# Patient Record
Sex: Female | Born: 1990 | Race: White | Hispanic: No | Marital: Single | Smoking: Current every day smoker
Health system: Southern US, Community
[De-identification: ages and names within clinical notes are randomized; demographics above are authoritative.]

---

## 2008-01-26 ENCOUNTER — Emergency Department: Payer: Self-pay | Admitting: Emergency Medicine

## 2008-05-23 ENCOUNTER — Emergency Department: Payer: Self-pay | Admitting: Emergency Medicine

## 2008-09-04 ENCOUNTER — Emergency Department: Payer: Self-pay | Admitting: Emergency Medicine

## 2009-04-03 ENCOUNTER — Inpatient Hospital Stay: Payer: Self-pay

## 2009-12-25 ENCOUNTER — Ambulatory Visit: Payer: Self-pay | Admitting: Internal Medicine

## 2010-04-26 ENCOUNTER — Emergency Department: Payer: Self-pay

## 2010-11-05 ENCOUNTER — Ambulatory Visit: Payer: Self-pay | Admitting: Internal Medicine

## 2010-12-22 DIAGNOSIS — O021 Missed abortion: Secondary | ICD-10-CM

## 2010-12-22 HISTORY — PX: DILATION AND EVACUATION: SHX1459

## 2011-03-11 ENCOUNTER — Emergency Department: Payer: Self-pay | Admitting: Unknown Physician Specialty

## 2011-04-14 ENCOUNTER — Emergency Department: Payer: Self-pay | Admitting: Emergency Medicine

## 2011-09-11 ENCOUNTER — Other Ambulatory Visit: Payer: Self-pay | Admitting: Internal Medicine

## 2011-09-11 MED ORDER — CLONAZEPAM 1 MG PO TABS: 1.0000 mg | ORAL_TABLET | Freq: Three times a day (TID) | ORAL | Status: DC | PRN

## 2011-09-11 NOTE — Telephone Encounter (Signed)
She is not going to be seen here because she has [place] [facility]. She will need to establish at the old practice for care. We can give one month refill.

## 2011-09-16 ENCOUNTER — Telehealth: Payer: Self-pay | Admitting: Internal Medicine

## 2011-09-16 NOTE — Telephone Encounter (Signed)
I think we filled one month. We should check with her pharmacy.

## 2011-09-16 NOTE — Telephone Encounter (Signed)
Patient called and stated she only needs 1 refill on her medication clonazepam until she setup's a appointment with [facility] but they can't see her until November.  Please advise.

## 2011-09-17 NOTE — Telephone Encounter (Signed)
Tried calling patient, but got recording stating that her number had been disconnected.

## 2011-09-17 NOTE — Telephone Encounter (Signed)
Since, we filled on Sept 20th, no refill until Oct 20th.

## 2011-09-17 NOTE — Telephone Encounter (Signed)
Checked with pharmacy. We did fill for one month on Sept 20, but [facility] will not be able to see patient until Nov. So patient is asking for one more refill to last the month of October.

## 2011-09-18 NOTE — Telephone Encounter (Signed)
Patient notified

## 2011-09-22 ENCOUNTER — Emergency Department: Payer: Self-pay | Admitting: Emergency Medicine

## 2011-09-22 NOTE — Telephone Encounter (Signed)
Patient called back again today, she says that when her rx for the klonopin was filled it was only for a quantity of 30 and she takes this 3 times a day so it will not last her until 10-11-11.

## 2011-09-22 NOTE — Telephone Encounter (Signed)
We can fill an additional 60pills.

## 2011-09-22 NOTE — Telephone Encounter (Signed)
Left message for patient to return my call.

## 2011-10-06 ENCOUNTER — Encounter: Payer: Self-pay | Admitting: Internal Medicine

## 2011-10-07 ENCOUNTER — Emergency Department: Payer: Self-pay | Admitting: *Deleted

## 2011-10-08 ENCOUNTER — Emergency Department: Payer: Self-pay | Admitting: Emergency Medicine

## 2011-10-21 ENCOUNTER — Other Ambulatory Visit: Payer: Self-pay | Admitting: Internal Medicine

## 2011-10-21 NOTE — Telephone Encounter (Signed)
Patient would like 90 day refill

## 2011-10-22 ENCOUNTER — Other Ambulatory Visit: Payer: Self-pay | Admitting: Internal Medicine

## 2011-10-23 ENCOUNTER — Telehealth: Payer: Self-pay | Admitting: Internal Medicine

## 2011-10-23 NOTE — Telephone Encounter (Signed)
Patient wanting additional refills for her Klonopin 1mg .

## 2011-10-23 NOTE — Telephone Encounter (Signed)
1 refill, no further

## 2011-10-24 ENCOUNTER — Other Ambulatory Visit: Payer: Self-pay | Admitting: *Deleted

## 2011-10-24 MED ORDER — CLONAZEPAM 1 MG PO TABS: 1.0000 mg | ORAL_TABLET | Freq: Three times a day (TID) | ORAL | Status: DC | PRN

## 2011-10-24 NOTE — Telephone Encounter (Signed)
Called in Rx.  Tried to contact patient and Phone has been disconnected.

## 2011-10-27 ENCOUNTER — Telehealth: Payer: Self-pay | Admitting: Internal Medicine

## 2011-10-27 ENCOUNTER — Telehealth: Payer: Self-pay | Admitting: *Deleted

## 2011-10-27 MED ORDER — CLONAZEPAM 1 MG PO TABS: 1.0000 mg | ORAL_TABLET | Freq: Three times a day (TID) | ORAL | Status: DC | PRN

## 2011-10-27 NOTE — Telephone Encounter (Signed)
Pt aware that we called in klonopin #90 and that she needs OV for any further RFs. She will call back to schedule f/u

## 2011-10-27 NOTE — Telephone Encounter (Signed)
Called in RX. Attempted to call patient. No answer, no VM.

## 2011-10-27 NOTE — Telephone Encounter (Signed)
Ok for # 90? Pt has no upcoming apts.

## 2011-10-27 NOTE — Telephone Encounter (Signed)
She can have #90 with NO further refills.

## 2011-10-27 NOTE — Telephone Encounter (Signed)
[phone] Pt called wanted to know what only 30 pills were called in for clonazepam.  She normaly gets 90.  See takes 3 at a time as needed. [facility] [address]

## 2011-10-31 NOTE — Telephone Encounter (Signed)
Patient states that she did get the medication from the pharmacy. I did apologize for takiing  So long to follow up.  She said that it was all right and that she was very thankful that I did follow up.

## 2011-11-28 ENCOUNTER — Emergency Department: Payer: Self-pay | Admitting: Internal Medicine

## 2012-01-07 ENCOUNTER — Emergency Department: Payer: Self-pay | Admitting: Emergency Medicine

## 2012-01-08 LAB — URINALYSIS, COMPLETE
Bilirubin,UR: NEGATIVE
Glucose,UR: NEGATIVE mg/dL (ref 0–75)
Ketone: NEGATIVE
Specific Gravity: 1.021 (ref 1.003–1.030)
Squamous Epithelial: 1
WBC UR: 973 /HPF (ref 0–5)

## 2012-01-08 LAB — DRUG SCREEN, URINE
Barbiturates, Ur Screen: NEGATIVE (ref ?–200)
Cannabinoid 50 Ng, Ur ~~LOC~~: NEGATIVE (ref ?–50)
Cocaine Metabolite,Ur ~~LOC~~: POSITIVE (ref ?–300)
MDMA (Ecstasy)Ur Screen: NEGATIVE (ref ?–500)
Phencyclidine (PCP) Ur S: NEGATIVE (ref ?–25)

## 2012-01-08 LAB — PREGNANCY, URINE: Pregnancy Test, Urine: NEGATIVE m[IU]/mL

## 2012-04-30 ENCOUNTER — Emergency Department: Payer: Self-pay | Admitting: Emergency Medicine

## 2012-04-30 LAB — COMPREHENSIVE METABOLIC PANEL
Albumin: 3.7 g/dL (ref 3.4–5.0)
Alkaline Phosphatase: 51 U/L (ref 50–136)
Anion Gap: 10 (ref 7–16)
BUN: 19 mg/dL — ABNORMAL HIGH (ref 7–18)
Bilirubin,Total: 0.5 mg/dL (ref 0.2–1.0)
Calcium, Total: 8 mg/dL — ABNORMAL LOW (ref 8.5–10.1)
Chloride: 104 mmol/L (ref 98–107)
Co2: 23 mmol/L (ref 21–32)
Creatinine: 1 mg/dL (ref 0.60–1.30)
EGFR (Non-African Amer.): >60
Osmolality: 280 (ref 275–301)
Potassium: 3 mmol/L — ABNORMAL LOW (ref 3.5–5.1)
SGOT(AST): 25 U/L (ref 15–37)
SGPT (ALT): 16 U/L
Total Protein: 6.6 g/dL (ref 6.4–8.2)

## 2012-04-30 LAB — DRUG SCREEN, URINE
Benzodiazepine, Ur Scrn: NEGATIVE (ref ?–200)
Cannabinoid 50 Ng, Ur ~~LOC~~: POSITIVE (ref ?–50)
Opiate, Ur Screen: NEGATIVE (ref ?–300)
Phencyclidine (PCP) Ur S: NEGATIVE (ref ?–25)

## 2012-04-30 LAB — PREGNANCY, URINE: Pregnancy Test, Urine: NEGATIVE m[IU]/mL

## 2012-04-30 LAB — CBC
HGB: 11.5 g/dL — ABNORMAL LOW (ref 12.0–16.0)
MCH: 28.9 pg (ref 26.0–34.0)
MCHC: 33.1 g/dL (ref 32.0–36.0)
MCV: 87 fL (ref 80–100)
RBC: 3.99 10*6/uL (ref 3.80–5.20)
WBC: 9.8 10*3/uL (ref 3.6–11.0)

## 2012-04-30 LAB — URINALYSIS, COMPLETE
Bacteria: NONE SEEN
Blood: NEGATIVE
Glucose,UR: NEGATIVE mg/dL (ref 0–75)
Leukocyte Esterase: NEGATIVE
Nitrite: NEGATIVE
Ph: 6 (ref 4.5–8.0)
WBC UR: <1 /HPF (ref 0–5)

## 2012-04-30 LAB — ACETAMINOPHEN LEVEL: Acetaminophen: 2 ug/mL — ABNORMAL LOW

## 2012-04-30 LAB — ETHANOL: Ethanol %: <0.003 % (ref 0.000–0.080)

## 2019-04-22 DIAGNOSIS — T424X1A Poisoning by benzodiazepines, accidental (unintentional), initial encounter: Secondary | ICD-10-CM

## 2019-04-22 HISTORY — DX: Poisoning by benzodiazepines, accidental (unintentional), initial encounter: T42.4X1A

## 2019-05-10 ENCOUNTER — Inpatient Hospital Stay
Admission: EM | Admit: 2019-05-10 | Discharge: 2019-05-11 | DRG: 917 | Discharge status: Against Medical Advice | Payer: Self-pay | Attending: Specialist | Admitting: Specialist

## 2019-05-10 ENCOUNTER — Encounter: Payer: Self-pay | Admitting: Emergency Medicine

## 2019-05-10 ENCOUNTER — Other Ambulatory Visit: Payer: Self-pay

## 2019-05-10 DIAGNOSIS — F19929 Other psychoactive substance use, unspecified with intoxication, unspecified: Secondary | ICD-10-CM

## 2019-05-10 DIAGNOSIS — G92 Toxic encephalopathy: Secondary | ICD-10-CM | POA: Diagnosis present

## 2019-05-10 DIAGNOSIS — F17200 Nicotine dependence, unspecified, uncomplicated: Secondary | ICD-10-CM | POA: Diagnosis present

## 2019-05-10 DIAGNOSIS — R4182 Altered mental status, unspecified: Secondary | ICD-10-CM

## 2019-05-10 DIAGNOSIS — F10129 Alcohol abuse with intoxication, unspecified: Secondary | ICD-10-CM | POA: Diagnosis present

## 2019-05-10 DIAGNOSIS — T424X1A Poisoning by benzodiazepines, accidental (unintentional), initial encounter: Principal | ICD-10-CM | POA: Diagnosis present

## 2019-05-10 DIAGNOSIS — H5704 Mydriasis: Secondary | ICD-10-CM | POA: Diagnosis present

## 2019-05-10 DIAGNOSIS — D649 Anemia, unspecified: Secondary | ICD-10-CM | POA: Diagnosis present

## 2019-05-10 DIAGNOSIS — Z1159 Encounter for screening for other viral diseases: Secondary | ICD-10-CM

## 2019-05-10 LAB — COMPREHENSIVE METABOLIC PANEL
ALT: 12 U/L (ref 0–44)
AST: 17 U/L (ref 15–41)
Albumin: 3.6 g/dL (ref 3.5–5.0)
Alkaline Phosphatase: 33 U/L — ABNORMAL LOW (ref 38–126)
Anion gap: 6 (ref 5–15)
BUN: 16 mg/dL (ref 6–20)
CO2: 26 mmol/L (ref 22–32)
Calcium: 8.8 mg/dL — ABNORMAL LOW (ref 8.9–10.3)
Chloride: 104 mmol/L (ref 98–111)
Creatinine, Ser: 0.48 mg/dL (ref 0.44–1.00)
GFR calc Af Amer: >60 mL/min (ref >60)
GFR calc non Af Amer: >60 mL/min (ref >60)
Glucose, Bld: 98 mg/dL (ref 70–99)
Potassium: 3.9 mmol/L (ref 3.5–5.1)
Sodium: 136 mmol/L (ref 135–145)
Total Bilirubin: 0.4 mg/dL (ref 0.3–1.2)
Total Protein: 6.6 g/dL (ref 6.5–8.1)

## 2019-05-10 LAB — SALICYLATE LEVEL: Salicylate Lvl: <7 mg/dL (ref 2.8–30.0)

## 2019-05-10 LAB — CBC
HCT: 31.8 % — ABNORMAL LOW (ref 36.0–46.0)
Hemoglobin: 10 g/dL — ABNORMAL LOW (ref 12.0–15.0)
MCH: 24.6 pg — ABNORMAL LOW (ref 26.0–34.0)
MCHC: 31.4 g/dL (ref 30.0–36.0)
MCV: 78.1 fL — ABNORMAL LOW (ref 80.0–100.0)
Platelets: 336 10*3/uL (ref 150–400)
RBC: 4.07 MIL/uL (ref 3.87–5.11)
RDW: 18.8 % — ABNORMAL HIGH (ref 11.5–15.5)
WBC: 8.7 10*3/uL (ref 4.0–10.5)
nRBC: 0 % (ref 0.0–0.2)

## 2019-05-10 LAB — ETHANOL: Alcohol, Ethyl (B): <10 mg/dL (ref <10)

## 2019-05-10 LAB — ACETAMINOPHEN LEVEL: Acetaminophen (Tylenol), Serum: <10 ug/mL — ABNORMAL LOW (ref 10–30)

## 2019-05-10 MED ORDER — NALOXONE HCL 2 MG/2ML IJ SOSY
1.0000 mg | PREFILLED_SYRINGE | Freq: Once | INTRAMUSCULAR | Status: AC
  Administered 2019-05-10: 23:00:00 1 mg via INTRAVENOUS
  Filled 2019-05-10: qty 2

## 2019-05-10 MED ORDER — SODIUM CHLORIDE 0.9 % IV BOLUS
1000.0000 mL | Freq: Once | INTRAVENOUS | Status: AC
  Administered 2019-05-10: 1000 mL via INTRAVENOUS

## 2019-05-10 NOTE — ED Provider Notes (Signed)
[facility] Emergency Department Provider Note  Time seen: 10:34 PM  I have reviewed the triage vital signs and the nursing notes.   HISTORY  Chief Complaint Drug Overdose   HPI Sheila Guzman is a 28 y.o. female with no known past medical history presents to the emergency department after a likely overdose.  According to EMS report patient was at [facility], they were speaking to the patient while possible shoplifting outside of [employer] when the patient went unresponsive.  Boyfriend states the patient took an unknown amount of Xanax or Klonopin, it is unclear if she was drinking alcohol.  Upon arrival patient is very somnolent, will awaken to voice or physical stimuli, very slurred speech will attempt answer questions, denies taking any medications.  Patient falls asleep but not being actively engaged.  Patient's pupils are dilated.  She is satting 100% on room air.  Unable to answer review of systems questioning unable to follow many commands.   History reviewed. No pertinent past medical history.  There are no active problems to display for this patient.   History reviewed. No pertinent surgical history.  Prior to Admission medications   Medication Sig Start Date End Date Taking? Authorizing Provider  clonazePAM (KLONOPIN) 1 MG tablet Take 1 tablet (1 mg total) by mouth 3 (three) times daily as needed for anxiety. 10/27/11 10/26/12  Shelia Media, MD    Allergies  Allergen Reactions  . Amoxicillin     No family history on file.  Social History Social History   Tobacco Use  . Smoking status: Current Every Day Smoker  . Smokeless tobacco: Never Used  Substance Use Topics  . Alcohol use: Yes  . Drug use: Yes    Review of Systems Unable to obtain adequate/accurate review of systems secondary to somnolence/altered mental status  ____________________________________________   PHYSICAL EXAM:  VITAL SIGNS: ED Triage Vitals  Enc Vitals Group      BP 05/10/19 2212 126/90     Pulse Rate 05/10/19 2212 71     Resp 05/10/19 2212 (!) 24     Temp 05/10/19 2212 97.6 F (36.4 C)     Temp Source 05/10/19 2212 Oral     SpO2 05/10/19 2212 100 %     Weight 05/10/19 2213 188 lb (85.3 kg)     Height 05/10/19 2213 5\' 9"  (1.753 m)     Head Circumference --      Peak Flow --      Pain Score 05/10/19 2213 0     Pain Loc --      Pain Edu? --      Excl. in GC? --    Constitutional: Patient arrives extremely somnolent, does awaken to physical stimuli will briefly answer questions before falling back asleep.  Is unclear if her answers are accurate at this time.  Denies taking anything although the boyfriend states she did take Xanax or Klonopin.  Nurse states the patient did deny SI when she arrived. Eyes: 4 mm pupils, reactive. ENT      Head: Normocephalic and atraumatic.      Mouth/Throat: Dry mucous membranes Cardiovascular: Normal rate, regular rhythm. No murmurs, rubs, or gallops. Respiratory: Normal respiratory effort without tachypnea nor retractions. Breath sounds are clear Gastrointestinal: Soft, no reaction to abdominal palpation. Musculoskeletal: Patient does appear to move all extremities at times. Neurologic: Extremely somnolent, does appear to move all extremities at times. Skin:  Skin is warm, dry and intact.  Psychiatric: Slurred speech, extreme  somnolence  ____________________________________________   INITIAL IMPRESSION / ASSESSMENT AND PLAN / ED COURSE  Pertinent labs & imaging results that were available during my care of the patient were reviewed by me and considered in my medical decision making (see chart for details).   Patient presents emergency department after a possible overdose.  Per EMS boyfriend at the scene stated the patient took an unknown quantity of Xanax or Klonopin, possibly drinking alcohol as well.  Upon arrival patient is extremely somnolent, denies taking anything, falls asleep and not being  actively engaged.  Falls asleep even when being actively engaged after several seconds.  Patient does appear to be protecting her airway, we will hold off on intubation at this time.  Patient appears to be under the influence with dilated pupils.  We will check labs, obtain a urine drug screen.  We will treat with Narcan as a precaution with the possibility of polysubstance abuse.  Patient will likely require prolonged monitoring in the emergency department given her current state.  Blood work largely nonrevealing.  Alcohol negative.  Urinalysis and drug screen pending.  Acetaminophen/salicylate pending.  Patient care signed out to oncoming physician.  Sheila Guzman was evaluated in Emergency Department on 05/10/2019 for the symptoms described in the history of present illness. She was evaluated in the context of the global COVID-19 pandemic, which necessitated consideration that the patient might be at risk for infection with the SARS-CoV-2 virus that causes COVID-19. Institutional protocols and algorithms that pertain to the evaluation of patients at risk for COVID-19 are in a state of rapid change based on information released by regulatory bodies including the CDC and federal and state organizations. These policies and algorithms were followed during the patient's care in the ED.  ____________________________________________   FINAL CLINICAL IMPRESSION(S) / ED DIAGNOSES  Altered mental status Intoxication   Minna AntisPaduchowski, [place]esto Ganoe, MD 05/10/19 2252

## 2019-05-10 NOTE — ED Notes (Signed)
Pt awake, incoherent

## 2019-05-10 NOTE — ED Triage Notes (Signed)
Pt presents to ED via EMS from [employer]. Pt was found outside leaning against the vending machine unresponsive. Pt boyfriend reports pt took unknown amount of xanax and Zoloft. Pt responds to loud verbal stimuli but speech is slurred and garbled and she falls back to sleep quickly. Denies SI.

## 2019-05-10 NOTE — ED Notes (Signed)
Bed alarm placed under patient for her safety.

## 2019-05-10 NOTE — ED Notes (Signed)
Narcan given. Pt remains unresponsive.

## 2019-05-11 DIAGNOSIS — T424X1A Poisoning by benzodiazepines, accidental (unintentional), initial encounter: Principal | ICD-10-CM

## 2019-05-11 LAB — CBC
HCT: 32.4 % — ABNORMAL LOW (ref 36.0–46.0)
Hemoglobin: 10.3 g/dL — ABNORMAL LOW (ref 12.0–15.0)
MCH: 24.9 pg — ABNORMAL LOW (ref 26.0–34.0)
MCHC: 31.8 g/dL (ref 30.0–36.0)
MCV: 78.5 fL — ABNORMAL LOW (ref 80.0–100.0)
Platelets: 298 10*3/uL (ref 150–400)
RBC: 4.13 MIL/uL (ref 3.87–5.11)
RDW: 18.6 % — ABNORMAL HIGH (ref 11.5–15.5)
WBC: 7.5 10*3/uL (ref 4.0–10.5)
nRBC: 0 % (ref 0.0–0.2)

## 2019-05-11 LAB — URINALYSIS, COMPLETE (UACMP) WITH MICROSCOPIC
Bacteria, UA: NONE SEEN
Bilirubin Urine: NEGATIVE
Glucose, UA: NEGATIVE mg/dL
Ketones, ur: NEGATIVE mg/dL
Nitrite: NEGATIVE
Protein, ur: NEGATIVE mg/dL
Specific Gravity, Urine: 1.009 (ref 1.005–1.030)
pH: 7 (ref 5.0–8.0)

## 2019-05-11 LAB — MRSA PCR SCREENING: MRSA by PCR: NEGATIVE

## 2019-05-11 LAB — BASIC METABOLIC PANEL
Anion gap: 8 (ref 5–15)
BUN: 12 mg/dL (ref 6–20)
CO2: 21 mmol/L — ABNORMAL LOW (ref 22–32)
Calcium: 7.9 mg/dL — ABNORMAL LOW (ref 8.9–10.3)
Chloride: 106 mmol/L (ref 98–111)
Creatinine, Ser: 0.4 mg/dL — ABNORMAL LOW (ref 0.44–1.00)
GFR calc Af Amer: >60 mL/min (ref >60)
GFR calc non Af Amer: >60 mL/min (ref >60)
Glucose, Bld: 93 mg/dL (ref 70–99)
Potassium: 3.8 mmol/L (ref 3.5–5.1)
Sodium: 135 mmol/L (ref 135–145)

## 2019-05-11 LAB — URINE DRUG SCREEN, QUALITATIVE (ARMC ONLY)
Amphetamines, Ur Screen: NOT DETECTED
Barbiturates, Ur Screen: NOT DETECTED
Benzodiazepine, Ur Scrn: POSITIVE — AB
Cannabinoid 50 Ng, Ur ~~LOC~~: POSITIVE — AB
Cocaine Metabolite,Ur ~~LOC~~: NOT DETECTED
MDMA (Ecstasy)Ur Screen: NOT DETECTED
Methadone Scn, Ur: NOT DETECTED
Opiate, Ur Screen: NOT DETECTED
Phencyclidine (PCP) Ur S: NOT DETECTED
Tricyclic, Ur Screen: NOT DETECTED

## 2019-05-11 LAB — SARS CORONAVIRUS 2 BY RT PCR (HOSPITAL ORDER, PERFORMED IN ~~LOC~~ HOSPITAL LAB): SARS Coronavirus 2: NEGATIVE

## 2019-05-11 LAB — GLUCOSE, CAPILLARY: Glucose-Capillary: 93 mg/dL (ref 70–99)

## 2019-05-11 MED ORDER — ONDANSETRON HCL 4 MG/2ML IJ SOLN: 4.0000 mg | Freq: Four times a day (QID) | INTRAMUSCULAR | Status: DC | PRN

## 2019-05-11 MED ORDER — ACETAMINOPHEN 650 MG RE SUPP: 650.0000 mg | Freq: Four times a day (QID) | RECTAL | Status: DC | PRN

## 2019-05-11 MED ORDER — ONDANSETRON HCL 4 MG PO TABS: 4.0000 mg | ORAL_TABLET | Freq: Four times a day (QID) | ORAL | Status: DC | PRN

## 2019-05-11 MED ORDER — ENOXAPARIN SODIUM 40 MG/0.4ML ~~LOC~~ SOLN
40.0000 mg | SUBCUTANEOUS | Status: DC
  Administered 2019-05-11: 02:00:00 40 mg via SUBCUTANEOUS
  Filled 2019-05-11: qty 0.4

## 2019-05-11 MED ORDER — ACETAMINOPHEN 325 MG PO TABS: 650.0000 mg | ORAL_TABLET | Freq: Four times a day (QID) | ORAL | Status: DC | PRN

## 2019-05-11 NOTE — Progress Notes (Signed)
eLink Physician-Brief Progress Note Patient Name: Rhyder Grandin DOB: [birth year] MRN: [id number]   Date of Service  05/11/2019  HPI/Events of Note  28 year old female with no known past medical history who presents to [facility] ED on 05/10/2019 with complaints of altered mental status.  Per the patient's significant other, he states she took an unknown amount of her outpatient Klonopin with possible ingestion of alcohol, and then went to [place].  She was confronted at [facility] for possible shoplifting, then she became lethargic and altered.  Upon presentation to the ED she was noted to be somnolent with dilated pupils,  but would awaken to voice or physical stimuli.  She did receive a dose of Narcan without any noted improvement . She has been able to maintain her airway up to this point.  Initial work-up in the ED is unremarkable.  Serum Tylenol less than 10, salicylates less than 7, ethyl alcohol less than 10.  Urine drug screen and urinalysis are currently pending.  She is being admitted to stepdown unit for further evaluation and treatment of acute encephalopathy secondary to questionable benzodiazepine overdose.  PCCM is consulted for further management. VSS.  eICU Interventions  No new orders.      Intervention Category Evaluation Type: New Patient Evaluation  Lenell Antu 05/11/2019, 1:33 AM

## 2019-05-11 NOTE — ED Notes (Signed)
ED TO INPATIENT HANDOFF REPORT  ED Nurse Name and Phone #: Madelon Lips Name/Age/Gender Sheila Guzman 28 y.o. female Room/Bed: ED26A/ED26A  Code Status   Code Status: Not on file  Home/SNF/Other Home No Is this baseline? No   Triage Complete: Triage complete  Chief Complaint EMS  Triage Note Pt presents to ED via EMS from [employer]. Pt was found outside leaning against the vending machine unresponsive. Pt boyfriend reports pt took unknown amount of xanax and Zoloft. Pt responds to loud verbal stimuli but speech is slurred and garbled and she falls back to sleep quickly. Denies SI.     Allergies Allergies  Allergen Reactions  . Amoxicillin     Level of Care/Admitting Diagnosis ED Disposition    ED Disposition Condition Comment   Admit  Hospital Area: [facility] [[id number]]  Level of Care: Stepdown [14]  Covid Evaluation: Screening Protocol (No Symptoms)  Diagnosis: Benzodiazepine overdose [161096]  Admitting Physician: Oralia Manis [[id number]]  Attending Physician: Oralia Manis [phone]  Estimated length of stay: past midnight tomorrow  Certification:: I certify this patient will need inpatient services for at least 2 midnights  PT Class (Do Not Modify): Inpatient [101]  PT Acc Code (Do Not Modify): Private [1]       B Medical/Surgery History History reviewed. No pertinent past medical history. History reviewed. No pertinent surgical history.   A IV Location/Drains/Wounds Patient Lines/Drains/Airways Status   Active Line/Drains/Airways    Name:   Placement date:   Placement time:   Site:   Days:   Peripheral IV 05/10/19 Left Antecubital   05/10/19    2207    Antecubital   1          Intake/Output Last 24 hours No intake or output data in the 24 hours ending 05/11/19 0108  Labs/Imaging Results for orders placed or performed during the hospital encounter of 05/10/19 (from the past 48 hour(s))  CBC     Status: Abnormal   Collection  Time: 05/10/19 10:14 PM  Result Value Ref Range   WBC 8.7 4.0 - 10.5 K/uL   RBC 4.07 3.87 - 5.11 MIL/uL   Hemoglobin 10.0 (L) 12.0 - 15.0 g/dL   HCT 29.5 (L) 62.1 - 30.8 %   MCV 78.1 (L) 80.0 - 100.0 fL   MCH 24.6 (L) 26.0 - 34.0 pg   MCHC 31.4 30.0 - 36.0 g/dL   RDW 65.7 (H) 84.6 - 96.2 %   Platelets 336 150 - 400 K/uL   nRBC 0.0 0.0 - 0.2 %    Comment: Performed at [facility], [address] [place], [place] [address]  Comprehensive metabolic panel     Status: Abnormal   Collection Time: 05/10/19 10:14 PM  Result Value Ref Range   Sodium 136 135 - 145 mmol/L   Potassium 3.9 3.5 - 5.1 mmol/L   Chloride 104 98 - 111 mmol/L   CO2 26 22 - 32 mmol/L   Glucose, Bld 98 70 - 99 mg/dL   BUN 16 6 - 20 mg/dL   Creatinine, Ser 1.32 0.44 - 1.00 mg/dL   Calcium 8.8 (L) 8.9 - 10.3 mg/dL   Total Protein 6.6 6.5 - 8.1 g/dL   Albumin 3.6 3.5 - 5.0 g/dL   AST 17 15 - 41 U/L   ALT 12 0 - 44 U/L   Alkaline Phosphatase 33 (L) 38 - 126 U/L   Total Bilirubin 0.4 0.3 - 1.2 mg/dL   GFR calc non  Af Amer >60 >60 mL/min   GFR calc Af Amer >60 >60 mL/min   Anion gap 6 5 - 15    Comment: Performed at [facility], [address] [place], [place] [address]  Acetaminophen level     Status: Abnormal   Collection Time: 05/10/19 10:14 PM  Result Value Ref Range   Acetaminophen (Tylenol), Serum <10 (L) 10 - 30 ug/mL    Comment: (NOTE) Therapeutic concentrations vary significantly. A range of 10-30 ug/mL  may be an effective concentration for many patients. However, some  are best treated at concentrations outside of this range. Acetaminophen concentrations >150 ug/mL at 4 hours after ingestion  and >50 ug/mL at 12 hours after ingestion are often associated with  toxic reactions. Performed at [facility], [address] [place], [place] [address]   Salicylate level     Status: None   Collection Time: 05/10/19 10:14 PM  Result Value Ref Range   Salicylate Lvl <7.0  2.8 - 30.0 mg/dL    Comment: Performed at [facility], [address] [place], [place] [address]  Ethanol     Status: None   Collection Time: 05/10/19 10:14 PM  Result Value Ref Range   Alcohol, Ethyl (B) <10 <10 mg/dL    Comment: (NOTE) Lowest detectable limit for serum alcohol is 10 mg/dL. For medical purposes only. Performed at [facility], [address] [place], [place] [address]    No results found.  Pending Labs Unresulted Labs (From admission, onward)    Start     Ordered   05/11/19 0050  MRSA PCR Screening  Once,   STAT     05/11/19 0049   05/11/19 0013  SARS Coronavirus 2 (CEPHEID - Performed in [facility] hospital lab), Hosp Order  (Asymptomatic Patients Labs)  ONCE - STAT,   STAT    Question:  Rule Out  Answer:  Yes   05/11/19 0012   05/11/19 0012  Urinalysis, Complete w Microscopic  ONCE - STAT,   STAT     05/11/19 0012   05/11/19 0012  Urine Drug Screen, Qualitative ([facility] only)  ONCE - STAT,   STAT     05/11/19 0012   Signed and Held  HIV antibody (Routine Testing)  Once,   R     Signed and Held   Signed and Held  CBC  (enoxaparin (LOVENOX)    CrCl >/= 30 ml/min)  Once,   R    Comments:  Baseline for enoxaparin therapy IF NOT ALREADY DRAWN.  Notify MD if PLT < 100 K.    Signed and Held   Signed and Held  Creatinine, serum  (enoxaparin (LOVENOX)    CrCl >/= 30 ml/min)  Once,   R    Comments:  Baseline for enoxaparin therapy IF NOT ALREADY DRAWN.    Signed and Held   Signed and Held  Creatinine, serum  (enoxaparin (LOVENOX)    CrCl >/= 30 ml/min)  Weekly,   R    Comments:  while on enoxaparin therapy    Signed and Held   Signed and Held  Basic metabolic panel  Tomorrow morning,   R     Signed and Held   Signed and Held  CBC  Tomorrow morning,   R     Signed and Held          Vitals/Pain Today's Vitals   05/10/19 2230 05/10/19 2300 05/10/19 2315 05/10/19 2326  BP: 109/74 (!) 105/59  106/72  Pulse: (!) 58 60 (!) 58   Resp: (!)  26 (!) 31 (!) 28 (!) 23  Temp:      TempSrc:      SpO2: 97% 98% 100%   Weight:      Height:      PainSc: Asleep       Isolation Precautions No active isolations  Medications Medications  sodium chloride 0.9 % bolus 1,000 mL (0 mLs Intravenous Stopped 05/11/19 0048)  naloxone (NARCAN) injection 1 mg (1 mg Intravenous Given 05/10/19 2248)    Mobility walks High fall risk   Focused Assessments    R Recommendations: See Admitting Provider Note  Report given to:   Additional Notes:

## 2019-05-11 NOTE — Progress Notes (Signed)
Pt is alert and oriented. Vital signs stable Wanting to leaveNP came and explianiedt the risks. Pt signed out AMA.Escorted with sequrity

## 2019-05-11 NOTE — H&P (Signed)
[facility] [place] [facility]   PATIENT NAME: Sheila Guzman    MR#:  [id number]  DATE OF BIRTH:  May 05, 1991  DATE OF ADMISSION:  05/10/2019  PRIMARY CARE PHYSICIAN: Patient, No Pcp Per   REQUESTING/REFERRING PHYSICIAN: Manson PasseyBrown, MD  CHIEF COMPLAINT:   Chief Complaint  Patient presents with  . Drug Overdose    HISTORY OF PRESENT ILLNESS:  Sheila Guzman  is a 28 y.o. female who presents with chief complaint as above.  Patient brought to ED for somnolence and unresponsiveness.  Per her significant others report she took a large dose of Klonopin and drank some alcohol [place] [employer] [place].  Apparently she was in an altercation at [facility] with the police when she was confronted for potential shoplifting.  She then became very lethargic and altered.  Here in the ED work-up is largely reassuring from an infectious point of view, though urine studies are still pending.  Hospitalist called for admission  PAST MEDICAL HISTORY:  Patient unable to provide this information due to her current condition   PAST SURGICAL HISTORY:  Patient unable to provide this information due to her current condition   SOCIAL HISTORY:   Social History   Tobacco Use  . Smoking status: Current Every Day Smoker  . Smokeless tobacco: Never Used  Substance Use Topics  . Alcohol use: Yes     FAMILY HISTORY:  Patient unable to provide this information due to her current condition   DRUG ALLERGIES:   Allergies  Allergen Reactions  . Amoxicillin     MEDICATIONS AT HOME:   Prior to Admission medications   Medication Sig Start Date End Date Taking? Authorizing Provider  clonazePAM (KLONOPIN) 1 MG tablet Take 1 tablet (1 mg total) by mouth 3 (three) times daily as needed for anxiety. 10/27/11 10/26/12  Shelia MediaWalker, Jennifer A, MD    REVIEW OF SYSTEMS:  Review of Systems  Unable to perform ROS: Acuity of condition     VITAL SIGNS:   Vitals:   05/10/19 2213  05/10/19 2230 05/10/19 2300 05/10/19 2326  BP:  109/74 (!) 105/59 106/72  Pulse:  (!) 58 60   Resp:  (!) 26 (!) 31 (!) 23  Temp:      TempSrc:      SpO2:  97% 98%   Weight: 85.3 kg     Height: 5\' 9"  (1.753 m)      Wt Readings from Last 3 Encounters:  05/10/19 85.3 kg    PHYSICAL EXAMINATION:  Physical Exam  Vitals reviewed. Constitutional: She appears well-developed and well-nourished.  HENT:  Head: Normocephalic and atraumatic.  Mouth/Throat: Oropharynx is clear and moist.  Eyes: Pupils are equal, round, and reactive to light. Conjunctivae and EOM are normal. No scleral icterus.  Neck: Normal range of motion. Neck supple. No JVD present. No thyromegaly present.  Cardiovascular: Normal rate, regular rhythm and intact distal pulses. Exam reveals no gallop and no friction rub.  No murmur heard. Respiratory: Effort normal and breath sounds normal. No respiratory distress. She has no wheezes. She has no rales.  GI: Soft. Bowel sounds are normal. She exhibits no distension. There is no abdominal tenderness.  Musculoskeletal: Normal range of motion.        General: No edema.     Comments: No arthritis, no gout  Lymphadenopathy:    She has no cervical adenopathy.  Neurological:  Unable to fully assess due to patient condition.  Patient responds to painful stimuli (  sternal rub) only by moving her head and groaning.  Skin: Skin is warm and dry. No rash noted. No erythema.  Psychiatric:  Unable to assess due to patient condition    LABORATORY PANEL:   CBC Recent Labs  Lab 05/10/19 2214  WBC 8.7  HGB 10.0*  HCT 31.8*  PLT 336   ------------------------------------------------------------------------------------------------------------------  Chemistries  Recent Labs  Lab 05/10/19 2214  NA 136  K 3.9  CL 104  CO2 26  GLUCOSE 98  BUN 16  CREATININE 0.48  CALCIUM 8.8*  AST 17  ALT 12  ALKPHOS 33*  BILITOT 0.4    ------------------------------------------------------------------------------------------------------------------  Cardiac Enzymes No results for input(s): TROPONINI in the last 168 hours. ------------------------------------------------------------------------------------------------------------------  RADIOLOGY:  No results found.  EKG:  No orders found for this or any previous visit.  IMPRESSION AND PLAN:  Principal Problem:   Benzodiazepine overdose -patient is very lethargic but seems to be protecting her airway and maintaining her oxygen saturations.  We will monitor her closely with cardiac monitor and pulse ox.  She can be given additional Narcan if needed as it is unclear if she may have used some other substances as well.  Urine tox is pending.  She will likely need a psychiatry consult when she is more alert and able to participate in interview.  Chart review performed and case discussed with ED provider. Labs, imaging and/or ECG reviewed by provider and discussed with patient/family. Management plans discussed with the patient and/or family.  COVID-19 status: Test pending  DVT PROPHYLAXIS: SubQ lovenox   GI PROPHYLAXIS:  None  ADMISSION STATUS: Inpatient     CODE STATUS: Full  TOTAL TIME TAKING CARE OF THIS PATIENT: 45 minutes.   This patient was evaluated in the context of the global COVID-19 pandemic, which necessitated consideration that the patient might be at risk for infection with the SARS-CoV-2 virus that causes COVID-19. Institutional protocols and algorithms that pertain to the evaluation of patients at risk for COVID-19 are in a state of rapid change based on information released by regulatory bodies including the CDC and federal and state organizations. These policies and algorithms were followed to the best of this provider's knowledge to date during the patient's care at this facility.  Barney Drain 05/11/2019, 12:10 AM  [facility]   Office  [phone]  CC: Primary care physician; Patient, No Pcp Per  Note:  This document was prepared using Dragon voice recognition software and may include unintentional dictation errors.

## 2019-05-11 NOTE — Consult Note (Signed)
Name: Sheila Guzman MRN: [id number] DOB: 06/28/1991    ADMISSION DATE:  05/10/2019 CONSULTATION DATE:  05/11/2019  REFERRING Guzman :  Dr. Anne HahnWillis  CHIEF COMPLAINT:  Altered Mental Status  BRIEF PATIENT DESCRIPTION:  28 y.o. Female admitted with Altered Mental Status in setting of questionable Benzodiazepine overdose.  She reportedly took an unknown amount of her outpatient Klonopin.  Urine drug screen is positive for benzodiazepines and cannabinoid.  SIGNIFICANT EVENTS  05/11/19>>Admission to [facility] Stepdown  STUDIES:  N/A  CULTURES: SARS-CoV-2 PCR 5/20>> negative MRSA PCR 5/20>> Urine 5/20>>  ANTIBIOTICS: N/A  HISTORY OF PRESENT ILLNESS:   Sheila Guzman is a 28 year old female with no known past medical history who [facility] ED on 05/10/2019 with complaints of altered mental status.  Per the patient's significant other, he states she took an unknown amount of her outpatient Klonopin with possible ingestion of alcohol, and then went to [employer].  She [facility] confronted [facility] for possible [facility]opl[facility], then she became lethargic and altered.  Upon presentation to the ED she was noted to be somnolent with dilated pupils,  but would awaken to voice or physical stimuli.  She did receive a dose of Narcan without any noted improvement . She has been able to maintain her airway up to this point.  Initial work-up in the ED is unremarkable.  Serum Tylenol less than 10, salicylates less than 7, ethyl alcohol less than 10.  Urine drug screen and urinalysis are currently pending.  She is being admitted to stepdown unit for further evaluation and treatment of acute encephalopathy secondary to questionable benzodiazepine overdose.  PCCM is consulted for further management.  PAST MEDICAL HISTORY :   has no past medical history on file.  has no past surgical history on file. Prior to Admission medications   Medication Sig Start Date End Date Taking? Authorizing Provider  clonazePAM (KLONOPIN) 1 MG  tablet Take 1 tablet (1 mg total) by mouth 3 (three) times daily as needed for anxiety. 10/27/11 10/26/12  Sheila Guzman, Sheila Guzman   Allergies  Allergen Reactions  . Amoxicillin     FAMILY HISTORY:  family history is not on file. SOCIAL HISTORY:  reports that she has been smoking. She has never used smokeless tobacco. She reports current alcohol use. She reports current drug use.   COVID-19 DISASTER DECLARATION:  FULL CONTACT PHYSICAL EXAMINATION WAS NOT POSSIBLE DUE TO TREATMENT OF COVID-19 AND  CONSERVATION OF PERSONAL PROTECTIVE EQUIPMENT, LIMITED EXAM FINDINGS INCLUDE-  Patient assessed or the symptoms described in the history of present illness.  In the context of the Global COVID-19 pandemic, which necessitated consideration that the patient might be at risk for infection with the SARS-CoV-2 virus that causes COVID-19, Institutional protocols and algorithms that pertain to the evaluation of patients at risk for COVID-19 are in a state of rapid change based on information released by regulatory bodies including the CDC and federal and state organizations. These policies and algorithms were followed during the patient's care while in hospital.  REVIEW OF SYSTEMS:   Unable to obtain due to AMS  SUBJECTIVE:  Unable to obtain due to AMS  VITAL SIGNS: Temp:  [97.6 F (36.4 C)] 97.6 F (36.4 C) (05/19 2212) Pulse Rate:  [58-71] 60 (05/19 2300) Resp:  [23-31] 23 (05/19 2326) BP: (105-126)/(59-90) 106/72 (05/19 2326) SpO2:  [97 %-100 %] 98 % (05/19 2300) Weight:  [85.3 kg] 85.3 kg (05/19 2213)  PHYSICAL EXAMINATION: General: Acutely ill-appearing female, laying in bed, on room  air, no acute distress Neuro: Lethargic, withdraws from pain, pupils PERRLA HEENT: Atraumatic, normocephalic, neck supple, no JVD Cardiovascular: RRR, S1-S2, no murmurs rubs or gallops, 2+ pulses throughout Lungs: Clear to auscultation bilaterally, even, nonlabored, normal effort Abdomen: Soft, nontender,  nondistended, no guarding or rebound tenderness, bowel sounds positive x4 Musculoskeletal: Normal bulk and tone, no deformities, no edema Skin: Warm and dry, no obvious rashes lesions or ulcerations  Recent Labs  Lab 05/10/19 2214  NA 136  K 3.9  CL 104  CO2 26  BUN 16  CREATININE 0.48  GLUCOSE 98   Recent Labs  Lab 05/10/19 2214  HGB 10.0*  HCT 31.8*  WBC 8.7  PLT 336   No results found.  ASSESSMENT / PLAN:  Acute Metabolic Encephalopathy in setting of questionable Benzodiazepine overdose -ICU Monitoring -Provide supportive care -Frequent neuro checks -Aspiration precautions -Monitor for ability to maintain airway -High risk for intubation -Urine drug screen is positive for Benzodiazepines and Cannabis -Consider head CT -Consult Psychiatry, appreciate input  Anemia without s/sx of Bleeding -Monitor for S/Sx of bleeding -Trend CBC -Lovenox for VTE Prophylaxis  -Transfuse for Hgb <7      Disposition: Stepdown Goals of care: Full code VTE prophylaxis: Lovenox Updates: Unable to update patient due to altered mental status, no family present during NP rounds.  Harlon Ditty, AGACNP-BC [facility] Pager: [phone] Cell: [phone]  05/11/2019, 12:28 AM

## 2019-05-11 NOTE — Discharge Summary (Signed)
Pt is now awake and alert, wanting to leave.  Her neuro exam is within normal limits.  She is oriented to person, place, and time.  She is unclear of the events from last night, the last thing she remembers is taking a Xanax yesterday (of which she reports she has not taken in quite some time) and then going to run errands at [place].  She denies that her overdose was intentional, and she denies any suicidal ideation or any ideation of harming others. There is no documentation in H&P from pt's significant other (of who H&P was obtained from) stating that the overdose was intentional or in setting of any suicidal ideation.  Pt has no IVC orders. Vital signs are stable.  Have informed her that from a medical standpoint she hasn't been cleared for discharge, and that if she is insistent on leaving that she will have to leave Against Medical Advice.  Have discussed with her that leaving Against medical Advice carries risk such as she could become somnolent again and even death.  She is willing to take on those risks and leave against medical advice. She has signed AMA paperwork.   Pt is not being discharged (she is leaving AMA), therefore there are no discharge orders, prescriptions, or follow up appointments.   Harlon Ditty, AGACNP-BC [facility] Pager: [phone] Cell: [phone]

## 2019-05-12 LAB — HIV ANTIBODY (ROUTINE TESTING W REFLEX): HIV Screen 4th Generation wRfx: NONREACTIVE

## 2019-05-13 LAB — URINE CULTURE: Culture: >=100000 — AB

## 2019-08-17 ENCOUNTER — Emergency Department
Admission: EM | Admit: 2019-08-17 | Discharge: 2019-08-17 | Disposition: A | Discharge status: Home / Self Care | Payer: Self-pay | Attending: Emergency Medicine | Admitting: Emergency Medicine

## 2019-08-17 ENCOUNTER — Encounter: Payer: Self-pay | Admitting: Emergency Medicine

## 2019-08-17 ENCOUNTER — Other Ambulatory Visit: Payer: Self-pay

## 2019-08-17 DIAGNOSIS — H6690 Otitis media, unspecified, unspecified ear: Secondary | ICD-10-CM | POA: Insufficient documentation

## 2019-08-17 DIAGNOSIS — H6590 Unspecified nonsuppurative otitis media, unspecified ear: Secondary | ICD-10-CM

## 2019-08-17 DIAGNOSIS — H1131 Conjunctival hemorrhage, right eye: Secondary | ICD-10-CM

## 2019-08-17 DIAGNOSIS — F17200 Nicotine dependence, unspecified, uncomplicated: Secondary | ICD-10-CM | POA: Insufficient documentation

## 2019-08-17 DIAGNOSIS — H7290 Unspecified perforation of tympanic membrane, unspecified ear: Secondary | ICD-10-CM

## 2019-08-17 MED ORDER — TETRACAINE HCL 0.5 % OP SOLN
2.0000 [drp] | Freq: Once | OPHTHALMIC | Status: AC
  Administered 2019-08-17: 2 [drp] via OPHTHALMIC
  Filled 2019-08-17: qty 4

## 2019-08-17 MED ORDER — FLUORESCEIN SODIUM 1 MG OP STRP
1.0000 | ORAL_STRIP | Freq: Once | OPHTHALMIC | Status: AC
  Administered 2019-08-17: 13:00:00 1 via OPHTHALMIC
  Filled 2019-08-17: qty 1

## 2019-08-17 MED ORDER — DEXAMETHASONE 0.1 % OP SUSP: 2.0000 [drp] | Freq: Two times a day (BID) | OPHTHALMIC | 0 refills | Status: AC
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
## 2019-08-17 NOTE — ED Notes (Signed)
Visual acuity preformed. R eye 20/50, L eye 20/15 and bilaterally 20/13.

## 2019-09-09 ENCOUNTER — Encounter: Payer: Self-pay | Admitting: Advanced Practice Midwife

## 2019-10-26 ENCOUNTER — Other Ambulatory Visit: Payer: Self-pay

## 2019-10-26 DIAGNOSIS — Z20822 Contact with and (suspected) exposure to covid-19: Secondary | ICD-10-CM

## 2019-10-27 LAB — NOVEL CORONAVIRUS, NAA: SARS-CoV-2, NAA: NOT DETECTED

## 2019-11-01 ENCOUNTER — Emergency Department: Payer: Self-pay

## 2019-11-01 ENCOUNTER — Emergency Department
Admission: EM | Admit: 2019-11-01 | Discharge: 2019-11-01 | Disposition: A | Discharge status: Home / Self Care | Payer: Self-pay | Attending: Emergency Medicine | Admitting: Emergency Medicine

## 2019-11-01 ENCOUNTER — Other Ambulatory Visit: Payer: Self-pay

## 2019-11-01 DIAGNOSIS — Y999 Unspecified external cause status: Secondary | ICD-10-CM | POA: Insufficient documentation

## 2019-11-01 DIAGNOSIS — S0990XA Unspecified injury of head, initial encounter: Secondary | ICD-10-CM | POA: Insufficient documentation

## 2019-11-01 DIAGNOSIS — M7918 Myalgia, other site: Secondary | ICD-10-CM | POA: Insufficient documentation

## 2019-11-01 DIAGNOSIS — Y929 Unspecified place or not applicable: Secondary | ICD-10-CM | POA: Insufficient documentation

## 2019-11-01 DIAGNOSIS — S01511A Laceration without foreign body of lip, initial encounter: Secondary | ICD-10-CM | POA: Insufficient documentation

## 2019-11-01 DIAGNOSIS — Z3A18 18 weeks gestation of pregnancy: Secondary | ICD-10-CM | POA: Insufficient documentation

## 2019-11-01 DIAGNOSIS — Y939 Activity, unspecified: Secondary | ICD-10-CM | POA: Insufficient documentation

## 2019-11-01 DIAGNOSIS — T7491XA Unspecified adult maltreatment, confirmed, initial encounter: Secondary | ICD-10-CM

## 2019-11-01 DIAGNOSIS — O99330 Smoking (tobacco) complicating pregnancy, unspecified trimester: Secondary | ICD-10-CM | POA: Insufficient documentation

## 2019-11-01 DIAGNOSIS — S0083XA Contusion of other part of head, initial encounter: Secondary | ICD-10-CM

## 2019-11-01 DIAGNOSIS — F17210 Nicotine dependence, cigarettes, uncomplicated: Secondary | ICD-10-CM | POA: Insufficient documentation

## 2019-11-01 MED ORDER — LIDOCAINE HCL (PF) 1 % IJ SOLN
5.0000 mL | Freq: Once | INTRAMUSCULAR | Status: AC
  Administered 2019-11-01: 5 mL
  Filled 2019-11-01: qty 5

## 2019-11-01 MED ORDER — OXYCODONE HCL 5 MG PO TABS
5.0000 mg | ORAL_TABLET | Freq: Once | ORAL | Status: AC
  Administered 2019-11-01: 02:00:00 5 mg via ORAL
  Filled 2019-11-01: qty 1

## 2019-11-01 MED ORDER — OXYCODONE HCL 5 MG PO TABS: 5.0000 mg | ORAL_TABLET | Freq: Three times a day (TID) | ORAL | 0 refills | Status: DC | PRN

## 2019-11-01 MED ORDER — ACETAMINOPHEN 500 MG PO TABS
1000.0000 mg | ORAL_TABLET | Freq: Once | ORAL | Status: AC
  Administered 2019-11-01: 02:00:00 1000 mg via ORAL
  Filled 2019-11-01: qty 2
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
## 2019-11-01 NOTE — ED Notes (Signed)
Fetal heart tones 176. Pt is tearful. SANE RN called.
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
## 2019-11-01 NOTE — Discharge Instructions (Signed)
Pain: 1000mg  of tylenol 3 times a day.  Take 1 oxycodone every 4-6 hours as needed for breakthrough pain.     Interpersonal Violence   Interpersonal Violence aka Domestic Violence is defined as violence between people who have had a personal relationship. For example, someone you have ever dated, been married to or in a domestic partnership with. Someone with whom you have a child in common, or a current  household member.  Does one or more of the following  attempts to cause bodily injury, or intentionally causes bodily injury; places you or a member of your family or household in fear of imminent serious bodily injury; continued harassment that rises to such a level as to inflict substantial emotional distress; or commits any rape or sexual offense  You are not alone. Unfortunately domestic violence is very common. Domestic violence does not go away on its own and tends to get worse over time and more frequent. There are people who can help. There are resources included in these instructions. Evidence can be collected in case you want to notify law enforcement now or in the future. A forensic nurse can take photographs and create a medical/legal document of the incident. If you choose to report to law enforcement, they will request a copy of the chart which we can provide with your permission. We can call in social work or an advocate to help with safety planning and emergency placement in a shelter if you have no other safe options.  THE POLICE CAN HELP YOU:  Get to a safe place away from the violence.  Get information on how the court can help protect you against the violence.  Get necessary belongings from your home for you and your children.  Get copies of police reports about the violence.  File a complaint in criminal court.  Find where local criminal and family courts are located.             [facility] [place] with  Shelter Obtaining a Landscape architect (50B) Careers adviser Support Group Product/process development scientist with domestic violence related criminal charges Child Conservation officer, historic buildings Assistance Enrollment Job Readiness Budget Counseling  Coaching and Mentoring  Call your local domestic violence program for additional information and support.   [facility] [place]   336-641-SAFE Crisis Line [place] Crisis Line 5412827849 [facility] Domestic Violence Abuse Hotline  [phone]

## 2019-11-01 NOTE — ED Notes (Signed)
Patient discharged to home per MD order. Patient in stable condition, and deemed medically cleared by ED provider for discharge. Discharge instructions reviewed with patient/family using "Teach Back"; verbalized understanding of medication education and administration, and information about follow-up care. Denies further concerns. ° °

## 2019-11-01 NOTE — ED Notes (Signed)
MD at bedside with US.
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
## 2020-01-27 ENCOUNTER — Observation Stay
Admission: EM | Admit: 2020-01-27 | Discharge: 2020-01-27 | Disposition: A | Discharge status: Home / Self Care | Payer: BLUE CROSS/BLUE SHIELD | Attending: Certified Nurse Midwife | Admitting: Certified Nurse Midwife

## 2020-01-27 ENCOUNTER — Other Ambulatory Visit: Payer: Self-pay

## 2020-01-27 DIAGNOSIS — Z3A31 31 weeks gestation of pregnancy: Secondary | ICD-10-CM | POA: Diagnosis not present

## 2020-01-27 DIAGNOSIS — B373 Candidiasis of vulva and vagina: Secondary | ICD-10-CM | POA: Insufficient documentation

## 2020-01-27 DIAGNOSIS — O99333 Smoking (tobacco) complicating pregnancy, third trimester: Secondary | ICD-10-CM | POA: Diagnosis not present

## 2020-01-27 DIAGNOSIS — O23593 Infection of other part of genital tract in pregnancy, third trimester: Principal | ICD-10-CM | POA: Insufficient documentation

## 2020-01-27 DIAGNOSIS — N939 Abnormal uterine and vaginal bleeding, unspecified: Secondary | ICD-10-CM | POA: Diagnosis present

## 2020-01-27 MED ORDER — TERCONAZOLE 80 MG VA SUPP: 80.0000 mg | Freq: Every day | VAGINAL | 0 refills | Status: AC
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
## 2020-01-27 NOTE — OB Triage Note (Signed)
Patient here for spotting. She states that this happens sometimes when she stressed. She was at work and went to the bathroom there was BRB on the toilet paper. She denies leaking of fluids.

## 2020-01-29 ENCOUNTER — Encounter: Payer: Self-pay | Admitting: Obstetrics and Gynecology

## 2020-03-25 MED ORDER — POLYETHYLENE GLYCOL 3350 17 GM/SCOOP PO POWD
17.00 | ORAL | Status: DC
Start: ? — End: 2020-03-25

## 2020-03-25 MED ORDER — DIPHENHYDRAMINE HCL 25 MG PO CAPS
25.00 | ORAL_CAPSULE | ORAL | Status: DC
Start: ? — End: 2020-03-25

## 2020-03-25 MED ORDER — DSS 100 MG PO CAPS
200.00 | ORAL_CAPSULE | ORAL | Status: DC
Start: 2020-03-26 — End: 2020-03-25

## 2020-03-25 MED ORDER — ONDANSETRON 4 MG PO TBDP
4.00 | ORAL_TABLET | ORAL | Status: DC
Start: ? — End: 2020-03-25

## 2020-03-25 MED ORDER — PROMETHAZINE HCL 25 MG RE SUPP
25.00 | RECTAL | Status: DC
Start: ? — End: 2020-03-25

## 2020-03-25 MED ORDER — IBUPROFEN 600 MG PO TABS
600.00 | ORAL_TABLET | ORAL | Status: DC
Start: 2020-03-25 — End: 2020-03-25

## 2020-03-25 MED ORDER — CALCIUM CARBONATE 1250 (500 CA) MG PO CHEW
CHEWABLE_TABLET | ORAL | Status: DC
Start: ? — End: 2020-03-25

## 2020-03-25 MED ORDER — LACTATED RINGERS IV SOLN
125.00 | INTRAVENOUS | Status: DC
Start: ? — End: 2020-03-25

## 2020-03-25 MED ORDER — ACETAMINOPHEN 325 MG PO TABS
650.00 | ORAL_TABLET | ORAL | Status: DC
Start: ? — End: 2020-03-25

## 2020-03-25 MED ORDER — NICOTINE 14 MG/24HR TD PT24
1.00 | MEDICATED_PATCH | TRANSDERMAL | Status: DC
Start: 2020-03-26 — End: 2020-03-25

## 2020-03-25 MED ORDER — GENERIC EXTERNAL MEDICATION
12.50 | Status: DC
Start: ? — End: 2020-03-25

## 2020-03-25 MED ORDER — PNV PRENATAL PLUS MULTIVITAMIN 27-1 MG PO TABS
1.00 | ORAL_TABLET | ORAL | Status: DC
Start: 2020-03-26 — End: 2020-03-25

## 2020-03-25 MED ORDER — DIBUCAINE 1 % EX OINT
1.00 | TOPICAL_OINTMENT | CUTANEOUS | Status: DC
Start: ? — End: 2020-03-25

## 2020-08-10 ENCOUNTER — Emergency Department
Admission: EM | Admit: 2020-08-10 | Discharge: 2020-08-11 | Disposition: A | Discharge status: Home / Self Care | Payer: Medicaid Other | Attending: Emergency Medicine | Admitting: Emergency Medicine

## 2020-08-10 ENCOUNTER — Emergency Department: Payer: Medicaid Other

## 2020-08-10 ENCOUNTER — Other Ambulatory Visit: Payer: Self-pay

## 2020-08-10 ENCOUNTER — Encounter: Payer: Self-pay | Admitting: Emergency Medicine

## 2020-08-10 DIAGNOSIS — Y999 Unspecified external cause status: Secondary | ICD-10-CM | POA: Insufficient documentation

## 2020-08-10 DIAGNOSIS — S022XXA Fracture of nasal bones, initial encounter for closed fracture: Secondary | ICD-10-CM | POA: Diagnosis not present

## 2020-08-10 DIAGNOSIS — O9A211 Injury, poisoning and certain other consequences of external causes complicating pregnancy, first trimester: Secondary | ICD-10-CM | POA: Insufficient documentation

## 2020-08-10 DIAGNOSIS — O0931 Supervision of pregnancy with insufficient antenatal care, first trimester: Secondary | ICD-10-CM

## 2020-08-10 DIAGNOSIS — Y929 Unspecified place or not applicable: Secondary | ICD-10-CM | POA: Diagnosis not present

## 2020-08-10 DIAGNOSIS — Y939 Activity, unspecified: Secondary | ICD-10-CM | POA: Diagnosis not present

## 2020-08-10 DIAGNOSIS — Z3A00 Weeks of gestation of pregnancy not specified: Secondary | ICD-10-CM | POA: Diagnosis not present

## 2020-08-10 DIAGNOSIS — T1490XA Injury, unspecified, initial encounter: Secondary | ICD-10-CM

## 2020-08-10 DIAGNOSIS — F17200 Nicotine dependence, unspecified, uncomplicated: Secondary | ICD-10-CM | POA: Insufficient documentation

## 2020-08-10 DIAGNOSIS — O30041 Twin pregnancy, dichorionic/diamniotic, first trimester: Secondary | ICD-10-CM

## 2020-08-10 DIAGNOSIS — S0992XA Unspecified injury of nose, initial encounter: Secondary | ICD-10-CM | POA: Diagnosis present

## 2020-08-10 DIAGNOSIS — S02600A Fracture of unspecified part of body of mandible, initial encounter for closed fracture: Secondary | ICD-10-CM | POA: Diagnosis not present

## 2020-08-10 DIAGNOSIS — S02609A Fracture of mandible, unspecified, initial encounter for closed fracture: Secondary | ICD-10-CM

## 2020-08-10 LAB — BASIC METABOLIC PANEL
Anion gap: 10 (ref 5–15)
BUN: 15 mg/dL (ref 6–20)
CO2: 22 mmol/L (ref 22–32)
Calcium: 9.2 mg/dL (ref 8.9–10.3)
Chloride: 105 mmol/L (ref 98–111)
Creatinine, Ser: 0.66 mg/dL (ref 0.44–1.00)
GFR calc Af Amer: >60 mL/min (ref >60)
GFR calc non Af Amer: >60 mL/min (ref >60)
Glucose, Bld: 101 mg/dL — ABNORMAL HIGH (ref 70–99)
Potassium: 3.7 mmol/L (ref 3.5–5.1)
Sodium: 137 mmol/L (ref 135–145)

## 2020-08-10 LAB — CBC WITH DIFFERENTIAL/PLATELET
Abs Immature Granulocytes: 0.04 10*3/uL (ref 0.00–0.07)
Basophils Absolute: 0.1 10*3/uL (ref 0.0–0.1)
Basophils Relative: 1 %
Eosinophils Absolute: 0.1 10*3/uL (ref 0.0–0.5)
Eosinophils Relative: 1 %
HCT: 37.4 % (ref 36.0–46.0)
Hemoglobin: 13 g/dL (ref 12.0–15.0)
Immature Granulocytes: 0 %
Lymphocytes Relative: 16 %
Lymphs Abs: 1.6 10*3/uL (ref 0.7–4.0)
MCH: 29.9 pg (ref 26.0–34.0)
MCHC: 34.8 g/dL (ref 30.0–36.0)
MCV: 86 fL (ref 80.0–100.0)
Monocytes Absolute: 0.9 10*3/uL (ref 0.1–1.0)
Monocytes Relative: 9 %
Neutro Abs: 7.6 10*3/uL (ref 1.7–7.7)
Neutrophils Relative %: 73 %
Platelets: 252 10*3/uL (ref 150–400)
RBC: 4.35 MIL/uL (ref 3.87–5.11)
RDW: 12.3 % (ref 11.5–15.5)
WBC: 10.4 10*3/uL (ref 4.0–10.5)
nRBC: 0 % (ref 0.0–0.2)

## 2020-08-10 LAB — POCT PREGNANCY, URINE: Preg Test, Ur: POSITIVE — AB

## 2020-08-10 LAB — HCG, QUANTITATIVE, PREGNANCY: hCG, Beta Chain, Quant, S: 29304 m[IU]/mL — ABNORMAL HIGH (ref <5)

## 2020-08-10 MED ORDER — FENTANYL CITRATE (PF) 100 MCG/2ML IJ SOLN
50.0000 ug | Freq: Once | INTRAMUSCULAR | Status: AC
  Administered 2020-08-10: 50 ug via INTRAVENOUS
  Filled 2020-08-10: qty 2
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
## 2020-08-11 ENCOUNTER — Emergency Department: Payer: Medicaid Other

## 2020-08-11 MED ORDER — CLINDAMYCIN HCL 300 MG PO CAPS: 300.0000 mg | ORAL_CAPSULE | Freq: Three times a day (TID) | ORAL | 0 refills | Status: AC

## 2020-08-11 MED ORDER — CHLORHEXIDINE GLUCONATE 0.12 % MT SOLN: 15.0000 mL | Freq: Once | OROMUCOSAL | Status: DC

## 2020-08-11 MED ORDER — OXYCODONE HCL 5 MG PO TABS
5.0000 mg | ORAL_TABLET | Freq: Once | ORAL | Status: AC
  Administered 2020-08-11: 5 mg via ORAL
  Filled 2020-08-11: qty 1

## 2020-08-11 MED ORDER — CLINDAMYCIN HCL 150 MG PO CAPS
300.0000 mg | ORAL_CAPSULE | Freq: Once | ORAL | Status: AC
  Administered 2020-08-11: 300 mg via ORAL
  Filled 2020-08-11: qty 2

## 2020-08-11 MED ORDER — FENTANYL CITRATE (PF) 100 MCG/2ML IJ SOLN
50.0000 ug | Freq: Once | INTRAMUSCULAR | Status: AC
  Administered 2020-08-11: 50 ug via INTRAVENOUS
  Filled 2020-08-11: qty 2

## 2020-08-11 MED ORDER — OXYCODONE-ACETAMINOPHEN 5-325 MG PO TABS: 1.0000 | ORAL_TABLET | ORAL | 0 refills | Status: AC | PRN

## 2020-08-11 MED ORDER — CHLORHEXIDINE GLUCONATE 0.12 % MT SOLN: 15.0000 mL | Freq: Three times a day (TID) | OROMUCOSAL | 0 refills | Status: AC

## 2020-08-11 NOTE — Discharge Instructions (Addendum)
Take antibiotics 3 times a day for 7 days.  Use the chlorhexidine to swish around your teeth and spit 3 times a day.  Make sure that you do not chew anything.  Your diet should be soft or liquid until cleared by ENT.  Take Percocet as needed for pain.  Return to the emergency room for new or worsening pain, difficulty swallowing, or fever.  Otherwise make sure to call Dr. Anabel Halon office first thing Monday morning for close follow-up.  Follow-up with your OB/GYN for prenatal care.
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
